# Patient Record
Sex: Male | Born: 2000 | Race: White | Hispanic: No | Marital: Single | Smoking: Current every day smoker
Health system: Southern US, Community
[De-identification: ages and names within clinical notes are randomized; demographics above are authoritative.]

## PROBLEM LIST (undated history)

## (undated) DIAGNOSIS — R45851 Suicidal ideations: Secondary | ICD-10-CM

## (undated) DIAGNOSIS — F4310 Post-traumatic stress disorder, unspecified: Secondary | ICD-10-CM

## (undated) DIAGNOSIS — F909 Attention-deficit hyperactivity disorder, unspecified type: Secondary | ICD-10-CM

## (undated) DIAGNOSIS — F319 Bipolar disorder, unspecified: Secondary | ICD-10-CM

---
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
## 2020-01-21 ENCOUNTER — Emergency Department
Admission: EM | Admit: 2020-01-21 | Discharge: 2020-01-22 | Disposition: A | Discharge status: Home / Self Care | Payer: Medicaid Other | Attending: Emergency Medicine | Admitting: Emergency Medicine

## 2020-01-21 ENCOUNTER — Other Ambulatory Visit: Payer: Self-pay

## 2020-01-21 DIAGNOSIS — F17200 Nicotine dependence, unspecified, uncomplicated: Secondary | ICD-10-CM | POA: Insufficient documentation

## 2020-01-21 DIAGNOSIS — Z046 Encounter for general psychiatric examination, requested by authority: Secondary | ICD-10-CM | POA: Diagnosis not present

## 2020-01-21 DIAGNOSIS — F909 Attention-deficit hyperactivity disorder, unspecified type: Secondary | ICD-10-CM | POA: Insufficient documentation

## 2020-01-21 DIAGNOSIS — F17210 Nicotine dependence, cigarettes, uncomplicated: Secondary | ICD-10-CM | POA: Diagnosis not present

## 2020-01-21 DIAGNOSIS — R454 Irritability and anger: Secondary | ICD-10-CM | POA: Diagnosis not present

## 2020-01-21 DIAGNOSIS — Z79899 Other long term (current) drug therapy: Secondary | ICD-10-CM | POA: Insufficient documentation

## 2020-01-21 DIAGNOSIS — F4310 Post-traumatic stress disorder, unspecified: Secondary | ICD-10-CM | POA: Diagnosis not present

## 2020-01-21 DIAGNOSIS — F319 Bipolar disorder, unspecified: Secondary | ICD-10-CM | POA: Diagnosis not present

## 2020-01-21 DIAGNOSIS — R4689 Other symptoms and signs involving appearance and behavior: Secondary | ICD-10-CM | POA: Diagnosis present

## 2020-01-21 LAB — URINE DRUG SCREEN, QUALITATIVE (ARMC ONLY)
Amphetamines, Ur Screen: NOT DETECTED
Barbiturates, Ur Screen: NOT DETECTED
Benzodiazepine, Ur Scrn: NOT DETECTED
Cannabinoid 50 Ng, Ur ~~LOC~~: NOT DETECTED
Cocaine Metabolite,Ur ~~LOC~~: NOT DETECTED
MDMA (Ecstasy)Ur Screen: NOT DETECTED
Methadone Scn, Ur: NOT DETECTED
Opiate, Ur Screen: NOT DETECTED
Phencyclidine (PCP) Ur S: NOT DETECTED
Tricyclic, Ur Screen: NOT DETECTED

## 2020-01-21 LAB — COMPREHENSIVE METABOLIC PANEL
ALT: 83 U/L — ABNORMAL HIGH (ref 0–44)
AST: 52 U/L — ABNORMAL HIGH (ref 15–41)
Albumin: 4.3 g/dL (ref 3.5–5.0)
Alkaline Phosphatase: 41 U/L (ref 38–126)
Anion gap: 11 (ref 5–15)
BUN: 12 mg/dL (ref 6–20)
CO2: 25 mmol/L (ref 22–32)
Calcium: 9.7 mg/dL (ref 8.9–10.3)
Chloride: 108 mmol/L (ref 98–111)
Creatinine, Ser: 0.77 mg/dL (ref 0.61–1.24)
GFR calc Af Amer: >60 mL/min (ref >60)
GFR calc non Af Amer: >60 mL/min (ref >60)
Glucose, Bld: 114 mg/dL — ABNORMAL HIGH (ref 70–99)
Potassium: 3.8 mmol/L (ref 3.5–5.1)
Sodium: 144 mmol/L (ref 135–145)
Total Bilirubin: 0.7 mg/dL (ref 0.3–1.2)
Total Protein: 7.8 g/dL (ref 6.5–8.1)

## 2020-01-21 LAB — CBC
HCT: 44.8 % (ref 39.0–52.0)
Hemoglobin: 15.2 g/dL (ref 13.0–17.0)
MCH: 29.6 pg (ref 26.0–34.0)
MCHC: 33.9 g/dL (ref 30.0–36.0)
MCV: 87.2 fL (ref 80.0–100.0)
Platelets: 241 10*3/uL (ref 150–400)
RBC: 5.14 MIL/uL (ref 4.22–5.81)
RDW: 13.1 % (ref 11.5–15.5)
WBC: 8.1 10*3/uL (ref 4.0–10.5)
nRBC: 0 % (ref 0.0–0.2)

## 2020-01-21 LAB — ETHANOL: Alcohol, Ethyl (B): <10 mg/dL (ref <10)

## 2020-01-21 LAB — SALICYLATE LEVEL: Salicylate Lvl: <7 mg/dL — ABNORMAL LOW (ref 7.0–30.0)

## 2020-01-21 LAB — ACETAMINOPHEN LEVEL: Acetaminophen (Tylenol), Serum: <10 ug/mL — ABNORMAL LOW (ref 10–30)

## 2020-01-21 MED ORDER — IBUPROFEN 600 MG PO TABS
600.0000 mg | ORAL_TABLET | Freq: Once | ORAL | Status: AC
  Administered 2020-01-22: 600 mg via ORAL
  Filled 2020-01-21: qty 1

## 2020-01-21 NOTE — ED Notes (Signed)
Patient's legal Guardian, Nigel Bridgeman of [facility] DSS called to check on patient status. Ms Brooke Dare updated; she requested to be updated later on this evening when plan of care is established. Contact number [phone]
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
## 2020-01-21 NOTE — Discharge Instructions (Addendum)
Please seek medical attention and help for any thoughts about wanting to harm yourself, harm others, any concerning change in behavior, severe depression, inappropriate drug use or any other new or concerning symptoms. ° °

## 2020-01-21 NOTE — ED Notes (Signed)
This RN spoke with Leta Jungling caregiver from [facility] group home and informed her pt would be discharged. She states call her back and she will come pick up pt when he is ready.
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
## 2020-05-22 ENCOUNTER — Emergency Department
Admission: EM | Admit: 2020-05-22 | Discharge: 2020-05-23 | Disposition: A | Discharge status: Home / Self Care | Payer: Medicaid Other | Attending: Emergency Medicine | Admitting: Emergency Medicine

## 2020-05-22 ENCOUNTER — Other Ambulatory Visit: Payer: Self-pay

## 2020-05-22 DIAGNOSIS — F319 Bipolar disorder, unspecified: Secondary | ICD-10-CM | POA: Insufficient documentation

## 2020-05-22 DIAGNOSIS — F17210 Nicotine dependence, cigarettes, uncomplicated: Secondary | ICD-10-CM | POA: Diagnosis not present

## 2020-05-22 DIAGNOSIS — F70 Mild intellectual disabilities: Secondary | ICD-10-CM | POA: Diagnosis present

## 2020-05-22 DIAGNOSIS — F4325 Adjustment disorder with mixed disturbance of emotions and conduct: Secondary | ICD-10-CM | POA: Diagnosis present

## 2020-05-22 DIAGNOSIS — F902 Attention-deficit hyperactivity disorder, combined type: Secondary | ICD-10-CM | POA: Diagnosis present

## 2020-05-22 DIAGNOSIS — F209 Schizophrenia, unspecified: Secondary | ICD-10-CM

## 2020-05-22 DIAGNOSIS — F4310 Post-traumatic stress disorder, unspecified: Secondary | ICD-10-CM | POA: Diagnosis present

## 2020-05-22 DIAGNOSIS — R456 Violent behavior: Secondary | ICD-10-CM | POA: Diagnosis present

## 2020-05-22 DIAGNOSIS — F3481 Disruptive mood dysregulation disorder: Secondary | ICD-10-CM | POA: Diagnosis present

## 2020-05-22 LAB — CBC
HCT: 46.4 % (ref 39.0–52.0)
Hemoglobin: 15.7 g/dL (ref 13.0–17.0)
MCH: 29.9 pg (ref 26.0–34.0)
MCHC: 33.8 g/dL (ref 30.0–36.0)
MCV: 88.4 fL (ref 80.0–100.0)
Platelets: 272 10*3/uL (ref 150–400)
RBC: 5.25 MIL/uL (ref 4.22–5.81)
RDW: 12.2 % (ref 11.5–15.5)
WBC: 10.2 10*3/uL (ref 4.0–10.5)
nRBC: 0 % (ref 0.0–0.2)

## 2020-05-22 LAB — COMPREHENSIVE METABOLIC PANEL
ALT: 37 U/L (ref 0–44)
AST: 28 U/L (ref 15–41)
Albumin: 4.5 g/dL (ref 3.5–5.0)
Alkaline Phosphatase: 43 U/L (ref 38–126)
Anion gap: 10 (ref 5–15)
BUN: 12 mg/dL (ref 6–20)
CO2: 24 mmol/L (ref 22–32)
Calcium: 9.5 mg/dL (ref 8.9–10.3)
Chloride: 106 mmol/L (ref 98–111)
Creatinine, Ser: 0.86 mg/dL (ref 0.61–1.24)
GFR calc Af Amer: >60 mL/min (ref >60)
GFR calc non Af Amer: >60 mL/min (ref >60)
Glucose, Bld: 100 mg/dL — ABNORMAL HIGH (ref 70–99)
Potassium: 4 mmol/L (ref 3.5–5.1)
Sodium: 140 mmol/L (ref 135–145)
Total Bilirubin: 0.6 mg/dL (ref 0.3–1.2)
Total Protein: 7.8 g/dL (ref 6.5–8.1)

## 2020-05-22 LAB — ETHANOL: Alcohol, Ethyl (B): <10 mg/dL (ref <10)

## 2020-05-22 LAB — ACETAMINOPHEN LEVEL: Acetaminophen (Tylenol), Serum: <10 ug/mL — ABNORMAL LOW (ref 10–30)

## 2020-05-22 LAB — SALICYLATE LEVEL: Salicylate Lvl: <7 mg/dL — ABNORMAL LOW (ref 7.0–30.0)
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
## 2020-05-23 ENCOUNTER — Emergency Department: Payer: Medicaid Other

## 2020-05-23 LAB — VALPROIC ACID LEVEL: Valproic Acid Lvl: 80 ug/mL (ref 50.0–100.0)
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
## 2020-05-23 NOTE — Final Progress Note (Signed)
Physician Final Progress Note  Patient ID: William [name] MRN: [id number] DOB/AGE: 2001/10/03 19 y.o.  Admit date: 05/22/2020 Admitting provider: No admitting provider for patient encounter. Discharge date: 05/23/2020   Admission Diagnoses:    Bipolar disorder Mixed IED Generalized anxiety  Personality disorder NOS     Discharge Diagnoses:   Same including below    Active Problems:   Post traumatic stress disorder (PTSD)   Attention deficit hyperactivity disorder (ADHD), combined type, moderate   Mild intellectual disability   Disruptive mood dysregulation disorder (HCC)   Adjustment disorder with mixed disturbance of emotions and conduct    Consults:   Psych Significant Findings/ Diagnostic Studies:  Routine labs   Procedures:  IVC now rescinded   Discharge Condition: {stable contracts for safety   Alert cooperative oriented to person place date and time not clouded or fluctuant Mood pleasant no IED symptoms  Affect normal  No frank severe psychosis or mania  Contracts for safety  No active SI HI or plans noted   Disposition:  back to group home with Mom and others picking him up   Diet: regular   Discharge Activity:   As tolerated  Patient basically calmed down after coming to ER and elected to be safe and take meds.  He will go to outpatient and can go back to his group home and all.   He has no new medical problems complaints or side effects at discharge       Total time spent taking care of this patient: 30 -40 minutes  Signed: Roselind Messier 05/23/2020, 10:58 AM

## 2020-05-23 NOTE — ED Notes (Signed)
Pt's mother Charbel Los) on her way to pick up patient.

## 2020-05-23 NOTE — ED Notes (Signed)
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
## 2020-05-23 NOTE — Discharge Instructions (Addendum)
Please continue taking all your medicines.  Please follow-up with your caregivers.  Return for any further problems.

## 2020-05-23 NOTE — ED Notes (Signed)
RN spoke to Ms. Hope at [facility] group home [phone]).  Pt is welcome to return when discharged.   Ms. Bridgette Habermann also told this RN the patient's current legal guardian is Caleen Essex [phone]).
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
## 2020-05-23 NOTE — ED Notes (Addendum)
RN spoke with Ms. William [name] (legal guardian).  Pt is allowed to discharge with his mom to return to the group home. Pt dressing for discharge.

## 2020-05-23 NOTE — ED Notes (Signed)
RN attempted to contact pt's legal guardian (Ms. Brooke Dare [phone]).  RN left HIPPA complaint message requesting return phone call.

## 2020-05-23 NOTE — BH Assessment (Addendum)
TTS reassessment completed. Pt presented calm, pleasant and oriented x 4. Pt reports to have learned his lesson and plans to stay away from conflict in the [facility]. Pt denies any SI/HI/AH/VH and was able to contract his safety.  Per Dr. Smith Robert pt will be discharged back to the [facility] (Ms. [facility] group home)  Amy, RN located contacted pt current [facility] and was provided pt guardian information Midge Minium, [phone]).   TTS attempted to make contact with Pt guardian and was unsuccessful. TTS left HIPPA compliant voicemail's for the following:  Resha Karr (Pt Guardian)  Wanita Chamberlain supervisor) [phone] Andree Moro [facility] supervisor) [phone]

## 2020-05-23 NOTE — ED Notes (Signed)
Pt discharged to his mother to return to group home.  VS stable.  Discharge instructions reviewed with patient. Pt signed for discharge. Pt denies SI/HI. Legal guardian aware of disposition.
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
## 2020-05-26 ENCOUNTER — Other Ambulatory Visit: Payer: Self-pay

## 2020-06-02 ENCOUNTER — Other Ambulatory Visit: Payer: Self-pay

## 2020-06-09 ENCOUNTER — Other Ambulatory Visit: Payer: Self-pay

## 2020-06-09 ENCOUNTER — Ambulatory Visit (LOCAL_COMMUNITY_HEALTH_CENTER): Payer: Medicaid Other

## 2020-06-09 DIAGNOSIS — Z111 Encounter for screening for respiratory tuberculosis: Secondary | ICD-10-CM

## 2020-06-12 ENCOUNTER — Ambulatory Visit (LOCAL_COMMUNITY_HEALTH_CENTER): Payer: Medicaid Other | Admitting: Family Medicine

## 2020-06-12 ENCOUNTER — Other Ambulatory Visit: Payer: Self-pay

## 2020-06-12 DIAGNOSIS — Z111 Encounter for screening for respiratory tuberculosis: Secondary | ICD-10-CM

## 2020-06-12 LAB — TB SKIN TEST
Induration: 0 mm
TB Skin Test: NEGATIVE

## 2020-06-23 ENCOUNTER — Other Ambulatory Visit: Payer: Medicaid Other

## 2020-12-21 ENCOUNTER — Other Ambulatory Visit: Payer: Self-pay

## 2020-12-21 ENCOUNTER — Encounter: Payer: Self-pay | Admitting: General Surgery

## 2020-12-21 ENCOUNTER — Ambulatory Visit (INDEPENDENT_AMBULATORY_CARE_PROVIDER_SITE_OTHER): Payer: Medicaid Other | Admitting: General Surgery

## 2020-12-21 VITALS — BP 135/84 | HR 87 | Temp 98.3°F | Wt 333.2 lb

## 2020-12-21 DIAGNOSIS — N5082 Scrotal pain: Secondary | ICD-10-CM | POA: Diagnosis not present

## 2020-12-21 NOTE — Progress Notes (Addendum)
Patient ID: William [name], male   DOB: Nov 05, 2001, 19 y.o.   MRN: [id number]  Chief Complaint  Patient presents with  . New Patient (Initial Visit)    Lump in groin    HPI William [name] is a 19 y.o. male.   He apparently self-referred for a lump in his "private parts." He has not seen his primary care provider for this issue.  He states that he noticed the lump yesterday.  He says it is painful.  He denies any nausea or vomiting.  He denies any difficulty with urination.  No diarrhea or constipation.  No fevers or chills.  He is accompanied by his mother who says that he does have a history of "fluid around his testicles," but that William [name] has been living in an adult group home for some time and only recently returned to live with her, so she is not certain of all of his other medical issues.   Past Medical History:  Diagnosis Date  . ADHD (attention deficit hyperactivity disorder)   . Bipolar 1 disorder (HCC)   . PTSD (post-traumatic stress disorder)   . Suicidal ideation     History reviewed. No pertinent surgical history.  Family History  Family history unknown: Yes    Social History Social History   Tobacco Use  . Smoking status: Current Every Day Smoker  . Smokeless tobacco: Never Used  Substance Use Topics  . Alcohol use: Yes  . Drug use: Yes    Types: Marijuana    Allergies  Allergen Reactions  . Bee Venom Hives  . Mustard Seed Anaphylaxis  . Other     pollen  . Penicillins Other (See Comments)    Has patient had a PCN reaction causing immediate rash, facial/tongue/throat swelling, SOB or lightheadedness with hypotension: Unknown Has patient had a PCN reaction causing severe rash involving mucus membranes or skin necrosis: Unknown Has patient had a PCN reaction that required hospitalization: Unknown Has patient had a PCN reaction occurring within the last 10 years: No If all of the above answers are "NO", then may proceed with Cephalosporin use. "childhood  allergy"    Current Outpatient Medications  Medication Sig Dispense Refill  . divalproex (DEPAKOTE ER) 500 MG 24 hr tablet Take 3 tablets (1,500 mg total) by mouth at bedtime. 90 tablet 1  . lisinopril (ZESTRIL) 20 MG tablet Take 20 mg by mouth daily.    . traZODone (DESYREL) 50 MG tablet Take 50 mg by mouth at bedtime.     No current facility-administered medications for this visit.    Review of Systems Review of Systems  All other systems reviewed and are negative. Or as discussed in the history of present illness  Blood pressure 135/84, pulse 87, temperature 98.3 F (36.8 C), temperature source Oral, weight (!) 333 lb 3.2 oz (151.1 kg), SpO2 98 %.  Physical Exam Physical Exam Exam conducted with a chaperone present.  Constitutional:      General: He is not in acute distress.    Appearance: He is obese.  HENT:     Head: Normocephalic and atraumatic.     Nose:     Comments: Covered with a mask    Mouth/Throat:     Comments: Covered with a mask Eyes:     General: No scleral icterus.       Right eye: No discharge.        Left eye: No discharge.  Cardiovascular:     Rate and Rhythm: Normal  rate and regular rhythm.     Pulses: Normal pulses.  Pulmonary:     Effort: Pulmonary effort is normal.     Breath sounds: Normal breath sounds.  Abdominal:     General: Bowel sounds are normal.     Palpations: Abdomen is soft.     Tenderness: There is no abdominal tenderness.     Hernia: There is no hernia in the left inguinal area or right inguinal area.     Comments: Protuberant, consistent with his level of obesity.  Genitourinary:      Comments: When asked to demonstrate where the lump is, William [name] points to an area just below the head of his penis.  Visually, it appears to be a slightly dilated vein.  It is soft/compressible.  There is no surrounding erythema, induration, or any skin change.  No inguinal hernia is appreciated on exam.  I did not perform a testicular  evaluation. Musculoskeletal:     Cervical back: No rigidity.     Right lower leg: No edema.     Left lower leg: No edema.  Lymphadenopathy:     Cervical: No cervical adenopathy.  Skin:    General: Skin is warm and dry.  Neurological:     General: No focal deficit present.     Mental Status: He is alert and oriented to person, place, and time.  Psychiatric:        Mood and Affect: Mood normal.        Behavior: Behavior normal.     Data Reviewed There are a number of emergency department notes related to his mental health issues, but these are not pertinent to today's visit.  Looking further back into the electronic medical record, there is pediatric urology note from August 06, 2016, when he was evaluated for a varicocele.  Conservative management was selected at that time.  There was also an ultrasound performed related to that visit and I have copied the results here (I am unable to actually view the images):  EXAM: US SCROTUM DATE: 06/23/15 13:12:12 ACCESSION: [id number] UN DICTATED: 06/23/15 14:00:45 INTERPRETATION LOCATION: [facility]  CLINICAL INDICATION: 19 Year Old (M): N50.9 - Disorder of male genital organs.   COMPARISON: None.  TECHNIQUE: Ultrasound multiplanar gray-scale and color Doppler images of the scrotum were obtained using a linear transducer.  FINDINGS:   There are distended veins in the left hemiscrotum measuring up to 65mm. There is no significant change in size with Valsalva maneuver.  The testes are normal in size, shape and symmetry. There are no focal nodules or cysts. The right testis measures 3.7 x 2.6 x 2.3 cm and the left 3.6 x 2.4 x 3.2 cm.   There is normal, symmetric color Doppler activity in the right and left testes. The right and left epididymides are also normal in size, shape and color Doppler flow. The epididymal head on the right measures 9 mm transversely on the left 7 mm transversely. There is no evidence of testicular  torsion or epididymoorchitis.    There is no free fluid in the tunica vaginalis. The soft tissues of the scrotum are unremarkable.   IMPRESSION:  Left varicocele.  IMPRESSION: Left grade III varicocele without left testicular hypotrophy. The right testicle measures 16 mL and the left 20 mL.   Assessment This is a 19 year old young man with a prior history of left varicocele who apparently was scheduled in my office for evaluation of a "bump in left groin."  My assumption is  that the people fielding the communication thought it could potentially have been a hernia.  He does not have a hernia on exam and when asked to demonstrate the site of the bump, it is on his penis.  Plan We will place referral to urology for further evaluation and management, as penile and testicular lesions are not within the scope of my expertise or practice.  William [name] can be seen on an as-needed basis.    Duanne Guess 12/21/2020, 1:21 PM

## 2020-12-21 NOTE — Patient Instructions (Signed)
A referral to Urology has been placed for you. They will call you to schedule an appointment. If you have any concerns or questions, please feel free to call our office.

## 2021-01-11 ENCOUNTER — Encounter: Payer: Self-pay | Admitting: Urology

## 2021-01-11 ENCOUNTER — Other Ambulatory Visit: Payer: Self-pay

## 2021-01-11 ENCOUNTER — Ambulatory Visit (INDEPENDENT_AMBULATORY_CARE_PROVIDER_SITE_OTHER): Payer: Medicaid Other | Admitting: Urology

## 2021-01-11 VITALS — BP 146/91 | HR 106 | Ht 69.5 in | Wt 330.0 lb

## 2021-01-11 DIAGNOSIS — I861 Scrotal varices: Secondary | ICD-10-CM

## 2021-01-11 NOTE — Progress Notes (Signed)
   01/11/21 2:39 PM   York Pellant Jan 13, 2001 [id number]  CC: Scrotal and penile swelling  HPI: I saw Mr. Mcmeen and his mom in urology clinic today for the above issues.  He is a 20 year old male with morbid obesity (BMI 48), ADHD, bipolar 1 disorder, PTSD.  He reports some left scrotal swelling over the last few years that is stable, and non-painful.  He also has noticed a bump on the left distal shaft of the penis that also is not painful.  He denies any urinary complaints or gross hematuria.  Denies any history of UTIs.  He was previously followed by [facility] pediatric urology, and a scrotal ultrasound in 2017 showed a grade 3 left varicocele but normal testicles, and no difference in the size bilaterally.  They opted for observation at that time.   PMH: Past Medical History:  Diagnosis Date  . ADHD (attention deficit hyperactivity disorder)   . Bipolar 1 disorder (HCC)   . PTSD (post-traumatic stress disorder)   . Suicidal ideation     Family History: Family History  Family history unknown: Yes    Social History:  reports that he has been smoking cigarettes. He has been smoking about 0.25 packs per day. He has never used smokeless tobacco. He reports current alcohol use. He reports current drug use. Drug: Marijuana.  Physical Exam: BP (!) 146/91 (BP Location: Left Arm, Patient Position: Sitting, Cuff Size: Large)   Pulse (!) 106   Ht 5' 9.5" (1.765 m)   Wt (!) 330 lb (149.7 kg)   BMI 48.03 kg/m    Constitutional:  Alert and oriented, No acute distress. Cardiovascular: No clubbing, cyanosis, or edema. Respiratory: Normal respiratory effort, no increased work of breathing. GI: Abdomen is soft, nontender, nondistended, no abdominal masses GU: Circumcised phallus, subtle and nontender cord like structure in the left distal phallus, nontender, no skin changes.  Grade 3 left varicocele, nontender.  Testicles 20 cc and descended bilaterally without masses.  Assessment & Plan:    20 year old male with a number of comorbidities who presents with questions about a subtle penile lesion as well as a left varicocele.  We reviewed that no intervention is necessary for his left-sided varicocele that is asymptomatic with normal-sized testicles bilaterally no evidence of atrophy.  We reviewed other options like microsurgical varicocelectomy at [facility], and they would like to continue observation which is very reasonable.  Regarding the penile lesion, this feels benign, and is asymptomatic and I do not think this requires further evaluation or work-up.  Reassurance was provided.  Follow-up with urology as needed   Legrand Rams, MD 01/11/2021  [facility] [address], [address] [place], [place] [address] [phone]

## 2021-01-11 NOTE — Patient Instructions (Signed)
Varicocele  A varicocele is a swelling of veins in the scrotum. The scrotum is the sac that contains the testicles. Varicoceles can occur on either side of the scrotum, but they are more common on the left side. They occur most often in teenage boys and young men. In most cases, varicoceles are not a serious problem. They are usually small and painless and do not require treatment. Tests may be done to confirm the diagnosis. Treatment may be needed if:  A varicocele is large, causes a lot of pain, or causes pain when exercising.  Varicoceles are found on both sides of the scrotum.  A varicocele causes a decrease in the size of the testicle in a growing adolescent.  The person has fertility problems. What are the causes? This condition is the result of valves in the veins not working properly. Valves in the veins help to return blood from the scrotum and testicles to the heart. If these valves do not work well, blood flows backward and backs up into the veins, which causes the veins to swell. This is similar to what happens when varicose veins form in the leg. What are the signs or symptoms? Most varicoceles do not cause any symptoms. If symptoms do occur, they may include:  Swelling on one side of the scrotum. The swelling may be more obvious when you are standing up.  A lumpy feeling in the scrotum.  A heavy feeling on one side of the scrotum.  A dull ache in the scrotum, especially after exercise or prolonged standing or sitting.  Slower growth or reduced size of the testicle on the side of the varicocele (in young males).  Problems with fathering a child (fertility). This can occur if the testicle does not grow normally or if the condition causes problems with the sperm, such as a low sperm count or sperm that are not able to reach the egg (poor motility). How is this diagnosed? This condition is diagnosed based on:  Your medical history.  A physical exam. Your health care  provider may inspect and feel (palpate) the scrotal area to check for swollen or enlarged veins.  An ultrasound. This may be done to confirm the diagnosis and to help rule out other causes of the swelling. How is this treated? Treatment is usually not needed for this condition. If you have any pain, your health care provider may prescribe or recommend medicine to help relieve it. You may need regular exams so your health care provider can monitor the varicocele to ensure that it does not cause problems. When further treatment is needed, it may involve one of these options:  Varicocelectomy. This is a surgery in which the swollen veins are tied off so that the flow of blood goes to other veins instead.  Embolization. In this procedure, a small, thin tube (catheter) is used to place metal coils or other blocking items in the veins. This cuts off the blood flow to the swollen veins. Follow these instructions at home:  Take over-the-counter and prescription medicines only as told by your health care provider.  Wear supportive underwear.  Use an athletic supporter when participating in sports activities.  Keep all follow-up visits as told by your health care provider. This is important. Contact a health care provider if:  Your pain is increasing.  You have redness in the affected area.  Your testicle becomes enlarged, swollen, or painful.  You have swelling that does not decrease when you are lying down.    One of your testicles is smaller than the other. Get help right away if:  You develop swelling in your legs.  You have difficulty breathing. Summary  Varicocele is a condition in which the veins in the scrotum are swollen or enlarged.  In most cases, varicoceles do not require treatment.  Treatment may be needed if you have pain, have problems with infertility, or have a smaller testicle associated with the varicocele.  In some cases, the condition may be treated with a  procedure to cut off the flow of blood to the swollen veins. This information is not intended to replace advice given to you by your health care provider. Make sure you discuss any questions you have with your health care provider. Document Revised: 03/05/2019 Document Reviewed: 03/05/2019 Elsevier Patient Education  2021 Elsevier Inc.  

## 2021-01-12 LAB — URINALYSIS, COMPLETE
Bilirubin, UA: NEGATIVE
Glucose, UA: NEGATIVE
Leukocytes,UA: NEGATIVE
Nitrite, UA: NEGATIVE
RBC, UA: NEGATIVE
Specific Gravity, UA: 1.02 (ref 1.005–1.030)
Urobilinogen, Ur: 0.2 mg/dL (ref 0.2–1.0)
pH, UA: 7 (ref 5.0–7.5)

## 2021-01-12 LAB — MICROSCOPIC EXAMINATION: Bacteria, UA: NONE SEEN

## 2021-09-18 ENCOUNTER — Encounter: Payer: Self-pay | Admitting: General Surgery

## 2022-05-26 ENCOUNTER — Emergency Department
Admission: EM | Admit: 2022-05-26 | Discharge: 2022-05-26 | Disposition: A | Discharge status: Home / Self Care | Payer: Medicare Other | Attending: Emergency Medicine | Admitting: Emergency Medicine

## 2022-05-26 ENCOUNTER — Emergency Department: Payer: Medicare Other

## 2022-05-26 DIAGNOSIS — I10 Essential (primary) hypertension: Secondary | ICD-10-CM | POA: Insufficient documentation

## 2022-05-26 DIAGNOSIS — N50811 Right testicular pain: Secondary | ICD-10-CM | POA: Diagnosis present

## 2022-05-26 DIAGNOSIS — N132 Hydronephrosis with renal and ureteral calculous obstruction: Secondary | ICD-10-CM | POA: Insufficient documentation

## 2022-05-26 DIAGNOSIS — N200 Calculus of kidney: Secondary | ICD-10-CM

## 2022-05-26 LAB — BASIC METABOLIC PANEL
Anion gap: 8 (ref 5–15)
BUN: 10 mg/dL (ref 6–20)
CO2: 22 mmol/L (ref 22–32)
Calcium: 9.2 mg/dL (ref 8.9–10.3)
Chloride: 110 mmol/L (ref 98–111)
Creatinine, Ser: 0.82 mg/dL (ref 0.61–1.24)
GFR, Estimated: >60 mL/min (ref >60)
Glucose, Bld: 117 mg/dL — ABNORMAL HIGH (ref 70–99)
Potassium: 3.4 mmol/L — ABNORMAL LOW (ref 3.5–5.1)
Sodium: 140 mmol/L (ref 135–145)

## 2022-05-26 LAB — CBC WITH DIFFERENTIAL/PLATELET
Abs Immature Granulocytes: 0.03 10*3/uL (ref 0.00–0.07)
Basophils Absolute: 0.1 10*3/uL (ref 0.0–0.1)
Basophils Relative: 1 %
Eosinophils Absolute: 0.1 10*3/uL (ref 0.0–0.5)
Eosinophils Relative: 1 %
HCT: 48.2 % (ref 39.0–52.0)
Hemoglobin: 16.3 g/dL (ref 13.0–17.0)
Immature Granulocytes: 0 %
Lymphocytes Relative: 19 %
Lymphs Abs: 2.1 10*3/uL (ref 0.7–4.0)
MCH: 28.9 pg (ref 26.0–34.0)
MCHC: 33.8 g/dL (ref 30.0–36.0)
MCV: 85.5 fL (ref 80.0–100.0)
Monocytes Absolute: 0.6 10*3/uL (ref 0.1–1.0)
Monocytes Relative: 5 %
Neutro Abs: 8.3 10*3/uL — ABNORMAL HIGH (ref 1.7–7.7)
Neutrophils Relative %: 74 %
Platelets: 271 10*3/uL (ref 150–400)
RBC: 5.64 MIL/uL (ref 4.22–5.81)
RDW: 12.8 % (ref 11.5–15.5)
WBC: 11.2 10*3/uL — ABNORMAL HIGH (ref 4.0–10.5)
nRBC: 0 % (ref 0.0–0.2)

## 2022-05-26 LAB — URINALYSIS, ROUTINE W REFLEX MICROSCOPIC
Bacteria, UA: NONE SEEN
Bilirubin Urine: NEGATIVE
Glucose, UA: NEGATIVE mg/dL
Ketones, ur: NEGATIVE mg/dL
Leukocytes,Ua: NEGATIVE
Nitrite: NEGATIVE
Protein, ur: 30 mg/dL — AB
RBC / HPF: >50 RBC/hpf — ABNORMAL HIGH (ref 0–5)
Specific Gravity, Urine: 1.021 (ref 1.005–1.030)
Squamous Epithelial / HPF: NONE SEEN (ref 0–5)
pH: 6 (ref 5.0–8.0)

## 2022-05-26 MED ORDER — KETOROLAC TROMETHAMINE 15 MG/ML IJ SOLN
15.0000 mg | Freq: Once | INTRAMUSCULAR | Status: AC
  Administered 2022-05-26: 15 mg via INTRAVENOUS
  Filled 2022-05-26: qty 1

## 2022-05-26 MED ORDER — ONDANSETRON HCL 4 MG/2ML IJ SOLN
4.0000 mg | Freq: Once | INTRAMUSCULAR | Status: AC
  Administered 2022-05-26: 4 mg via INTRAVENOUS
  Filled 2022-05-26: qty 2

## 2022-05-26 MED ORDER — OXYCODONE-ACETAMINOPHEN 5-325 MG PO TABS: 1.0000 | ORAL_TABLET | Freq: Once | ORAL | Status: DC

## 2022-05-26 MED ORDER — KETOROLAC TROMETHAMINE 15 MG/ML IJ SOLN: 15.0000 mg | Freq: Once | INTRAMUSCULAR | Status: DC

## 2022-05-26 MED ORDER — ONDANSETRON 4 MG PO TBDP: 4.0000 mg | ORAL_TABLET | Freq: Once | ORAL | Status: DC

## 2022-05-26 NOTE — Discharge Instructions (Addendum)
You have a 5 mm kidney stone.  You can take ibuprofen 400 mg every 6 hours for pain.  Please follow-up with urology regarding the kidney stone.

## 2022-05-26 NOTE — ED Notes (Signed)
Pt A&O, pt given discharge instructions, pt ambulating with steady gait. 

## 2022-05-26 NOTE — ED Provider Notes (Signed)
[facility] Provider Note    Event Date/Time   First MD Initiated Contact with Patient 05/26/22 1121     (approximate)   History   Testicle Pain   HPI  William [name] is a 21 y.o. male with past medical history of bipolar disorder PTSD and ADHD presents with testicle pain.  Symptoms started this morning acutely.  Pain is in the right groin radiating to the right back.  No history of similar.  Is constant.  Denies dysuria or hematuria.  No history of kidney stones.  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   Left arm weakness 10/16/2016   Stuttering 10/16/2016   Varicocele 08/07/2016   Disruptive mood dysregulation disorder ([place]) 06/29/2016   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 --      Excl. in [place]? --     Most recent vital signs: Vitals:   05/26/22 1120 05/26/22 1123  BP: (!) 203/180 (!) 143/94  Pulse: 80   Resp: (!) 22    Temp: 98.5 F (36.9 C)   SpO2: 96%      General: Awake, appears mildly uncomfortable CV:  Good peripheral perfusion.  Resp:  Normal effort.  Abd:  No distention.  Tenderness to palpation in the right lower quadrant Neuro:             Awake, Alert, Oriented x 3  Other:  There is mild testicular tenderness on exam however there is no swelling there is no redness the testicle has normal lie cremasteric reflex is intact   ED Results / Procedures / Treatments  Labs (all labs ordered are listed, but only abnormal results are displayed) Labs Reviewed  URINALYSIS, ROUTINE W REFLEX MICROSCOPIC - Abnormal; Notable for the following components:      Result Value   Color, Urine YELLOW (*)    APPearance HAZY (*)    Hgb urine dipstick LARGE (*)    Protein, ur 30 (*)    RBC / HPF >50 (*)    All other components within normal limits  CBC WITH DIFFERENTIAL/PLATELET - Abnormal; Notable for the following components:   WBC 11.2 (*)    Neutro Abs  8.3 (*)    All other components within normal limits  BASIC METABOLIC PANEL - Abnormal; Notable for the following components:   Potassium 3.4 (*)    Glucose, Bld 117 (*)    All other components within normal limits     EKG     RADIOLOGY CT renal study reviewed and interpreted by myself shows a 5 mm right-sided kidney stone   PROCEDURES:  Critical Care performed: No  Procedures   MEDICATIONS ORDERED IN ED: Medications  ketorolac (TORADOL) 15 MG/ML injection 15 mg (15 mg Intravenous Given 05/26/22 1153)  ondansetron (ZOFRAN) injection 4 mg (4 mg Intravenous Given 05/26/22 1153)     IMPRESSION / MDM / ASSESSMENT AND PLAN / ED COURSE  I reviewed the triage vital signs and the nursing notes.                              Differential diagnosis includes, but is not limited to, kidney stone, appendicitis, testicular torsion  Patient is a 21 year old male presenting with acute onset of right testicular pain rating to the right back.   Symptoms started acutely this morning.  Pain is located in the right groin radiating to the right lower back he has no urinary symptoms has nausea but no vomiting.  Initially she is hypertensive this resolved without intervention.  Does appear mildly uncomfortable.  His GU exam is reassuring he has mild tenderness to palpation of the testicle but there is no redness no swelling no abnormal lie and there is a cremasteric reflex that is intact.  He does have tenderness in the right lower quadrant.  I suspect kidney stone based on his symptoms.  We will start with CT renal obtain urine and treat pain with Toradol and nausea with Zofran.     CT renal study shows a 5 mm kidney stone in the distal ureter with hydro-.  UA with RBCs but no WBCs.  Exam and presentation are not consistent with septic stone.  Patient's pain is controlled.  Will discharge advised NSAIDs and urology follow-up. FINAL CLINICAL IMPRESSION(S) / ED DIAGNOSES   Final diagnoses:  Kidney stone     Rx / DC Orders   ED Discharge Orders     None        Note:  This document was prepared using Dragon voice recognition software and may include unintentional dictation errors.   Rada Hay, MD 05/26/22 1253

## 2022-05-26 NOTE — ED Notes (Signed)
Legal guardian is with pt.

## 2022-05-26 NOTE — ED Notes (Addendum)
This RN at bedside to obtain urine sample. This RN noted blood on the floor, states that his IV "accidentally" came out when he was rolling in the bed.

## 2022-05-26 NOTE — ED Triage Notes (Signed)
Right sided testicle pain since this morning. Severe, sharp in nature. Making pt vomit.
